# Patient Record
Sex: Male | Born: 2000 | Race: Black or African American | Hispanic: No | Marital: Single | Smoking: Never smoker
Health system: Southern US, Community
[De-identification: ages and names within clinical notes are randomized; demographics above are authoritative.]

---
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
## 2021-01-25 ENCOUNTER — Encounter: Payer: Self-pay | Admitting: Family Medicine

## 2021-01-25 ENCOUNTER — Other Ambulatory Visit: Payer: Self-pay

## 2021-01-25 ENCOUNTER — Ambulatory Visit: Payer: Medicaid Other | Admitting: Family Medicine

## 2021-01-25 DIAGNOSIS — Z113 Encounter for screening for infections with a predominantly sexual mode of transmission: Secondary | ICD-10-CM | POA: Diagnosis not present

## 2021-01-25 LAB — HM HIV SCREENING LAB: HM HIV Screening: NEGATIVE

## 2021-01-25 NOTE — Progress Notes (Signed)
[facility] Department STI clinic/screening visit  Subjective:  Adrian Price is a 21 y.o. male being seen today for an STI screening visit. The patient reports they do not have symptoms.    Patient has the following medical conditions:  There are no problems to display for this patient.    Chief Complaint  Patient presents with   SEXUALLY TRANSMITTED DISEASE    screening    HPI  Patient reports here for screening, denies s/sx   Does the patient or their partner desires a pregnancy in the next year? No  Screening for MPX risk: Does the patient have an unexplained rash? No Is the patient MSM? No Does the patient endorse multiple sex partners or anonymous sex partners? No Did the patient have close or sexual contact with a person diagnosed with MPX? No Has the patient traveled outside the [place] where MPX is endemic? No Is there a high clinical suspicion for MPX-- evidenced by one of the following No  -Unlikely to be chickenpox  -Lymphadenopathy  -Rash that present in same phase of evolution on any given body part   See flowsheet for further details and programmatic requirements.    The following portions of the patient's history were reviewed and updated as appropriate: allergies, current medications, past medical history, past social history, past surgical history and problem list.  Objective:  There were no vitals filed for this visit.  Physical Exam Constitutional:      Appearance: Normal appearance.  HENT:     Head: Normocephalic.     Mouth/Throat:     Mouth: Mucous membranes are moist.     Pharynx: Oropharynx is clear. No oropharyngeal exudate.  Genitourinary:    Penis: Normal.      Testes: Normal.     Comments: No lice, nits, or pest, no lesions or odor discharge.  Denies pain or tenderness with paplation of testicles.  No lesions, ulcers or masses present.    Musculoskeletal:     Cervical back: Normal range of motion.  Lymphadenopathy:      Cervical: No cervical adenopathy.  Skin:    General: Skin is warm and dry.     Findings: No bruising, erythema, lesion or rash.  Neurological:     Mental Status: He is alert.  Psychiatric:        Mood and Affect: Mood normal.        Behavior: Behavior normal.      Assessment and Plan:  Adrian Price is a 21 y.o. male presenting to the [facility] Department for STI screening  1. Screening for venereal disease Patient accepted all screenings including urine CT/GC and bloodwork for HIV/RPR.  Patient meets criteria for HepB screening? Yes. Ordered? No - declined  Patient meets criteria for HepC screening? Yes. Ordered? No - declined   Discussed time line for State Lab results and that patient will be called with positive results and encouraged patient to call if he had not heard in 2 weeks.  Counseled to return or seek care for continued or worsening symptoms Recommended condom use with all sex  Patient is currently using  condoms   to prevent pregnancy.   - HIV [place] [facility] - Syphilis Serology, [place] [facility] - Chlamydia/Gonorrhea [place] [facility]   Return for as needed.  No future appointments.  Wendi Snipes, FNP

## 2021-02-01 ENCOUNTER — Telehealth: Payer: Self-pay

## 2021-02-01 NOTE — Telephone Encounter (Signed)
Calling pt regarding positive chlamydia result from 01/25/21 urine sample. Pt needs tx appt.

## 2021-02-02 NOTE — Telephone Encounter (Signed)
Phone call to pt at [phone]. Voicemail not set up yet.  Unable to leave message. Tried twice.  MyChart is pending.

## 2021-02-04 NOTE — Telephone Encounter (Signed)
Phone call to pt at [phone]. Voicemail not set up yet.  Unable to leave message.  Tried again. Pt answered phone and confirmed password from last visit. Pt counseled about positive chlamydia results.  Per pt request, tx appt schedule for 02/08/21. Pt states NKA.

## 2021-02-08 ENCOUNTER — Other Ambulatory Visit: Payer: Self-pay

## 2021-02-08 ENCOUNTER — Ambulatory Visit: Payer: Medicaid Other

## 2021-02-08 DIAGNOSIS — A749 Chlamydial infection, unspecified: Secondary | ICD-10-CM

## 2021-02-08 MED ORDER — DOXYCYCLINE HYCLATE 100 MG PO TABS: 100.0000 mg | ORAL_TABLET | Freq: Two times a day (BID) | ORAL | 0 refills | Status: AC

## 2021-02-08 NOTE — Progress Notes (Signed)
In Nurse Clinic for chlamydia treatment. Treated per SO Dr Karyl Kinnier with doxycycline 100 mg bid x 7 days. RN dispensed Doxycycline 100 mg #14 today. Instructions explained. Advised to contact [facility] if vomits within 2 hrs of taking med. Questions answered and reports understanding. Jerel Shepherd, RN

## 2021-07-06 ENCOUNTER — Encounter: Payer: Self-pay | Admitting: Nurse Practitioner

## 2021-07-06 ENCOUNTER — Ambulatory Visit: Payer: Self-pay | Admitting: Nurse Practitioner

## 2021-07-06 DIAGNOSIS — Z113 Encounter for screening for infections with a predominantly sexual mode of transmission: Secondary | ICD-10-CM

## 2021-07-06 LAB — HEPATITIS B SURFACE ANTIGEN: Hepatitis B Surface Ag: NONREACTIVE

## 2021-07-06 LAB — HM HIV SCREENING LAB: HM HIV Screening: NEGATIVE

## 2021-07-06 LAB — HM HEPATITIS C SCREENING LAB: HM Hepatitis Screen: NEGATIVE

## 2021-07-06 LAB — GRAM STAIN

## 2021-07-06 NOTE — Progress Notes (Signed)
Pt here for STD screening.  Gram stain results reviewed, no treatment required per SO.  Condoms declined.  Berdie Ogren, RN

## 2021-07-06 NOTE — Progress Notes (Signed)
[facility] Department STI clinic/screening visit  Subjective:  Adrian Price is a 21 y.o. male being seen today for an STI screening visit. The patient reports they do have symptoms.    Patient has the following medical conditions:  There are no problems to display for this patient.    Chief Complaint  Patient presents with   SEXUALLY TRANSMITTED DISEASE    Screening    HPI  Patient reports to clinic today for STD screening. Patient reports lower abdominal pain and dysuria for one month.    Does the patient or their partner desires a pregnancy in the next year? No  Screening for MPX risk: Does the patient have an unexplained rash? No Is the patient MSM? No Does the patient endorse multiple sex partners or anonymous sex partners? No Did the patient have close or sexual contact with a person diagnosed with MPX? No Has the patient traveled outside the [place] where MPX is endemic? No Is there a high clinical suspicion for MPX-- evidenced by one of the following No  -Unlikely to be chickenpox  -Lymphadenopathy  -Rash that present in same phase of evolution on any given body part   See flowsheet for further details and programmatic requirements.    There is no immunization history on file for this patient.   The following portions of the patient's history were reviewed and updated as appropriate: allergies, current medications, past medical history, past social history, past surgical history and problem list.  Objective:  There were no vitals filed for this visit.  Physical Exam Constitutional:      Appearance: Normal appearance.  HENT:     Head: Normocephalic. No abrasion, masses or laceration. Hair is normal.     Right Ear: External ear normal.     Left Ear: External ear normal.     Nose: Nose normal.     Mouth/Throat:     Lips: Pink.     Mouth: Mucous membranes are moist. No oral lesions.     Dentition: No dental caries.     Pharynx: No pharyngeal  swelling, oropharyngeal exudate, posterior oropharyngeal erythema or uvula swelling.     Tonsils: No tonsillar exudate or tonsillar abscesses.  Eyes:     General: Lids are normal.        Right eye: No discharge.        Left eye: No discharge.     Conjunctiva/sclera: Conjunctivae normal.     Right eye: No exudate.    Left eye: No exudate.    Pupils: Pupils are equal, round, and reactive to light.  Abdominal:     General: Abdomen is flat.     Palpations: Abdomen is soft.     Tenderness: There is no abdominal tenderness. There is no rebound.  Genitourinary:    Pubic Area: No rash or pubic lice.      Penis: Normal and circumcised. No erythema or discharge.      Testes: Normal.        Right: Mass or tenderness not present.        Left: Mass or tenderness not present.     Rectum: Normal.     Comments: Discharge amount: None Color: None  Musculoskeletal:     Cervical back: Full passive range of motion without pain, normal range of motion and neck supple.  Lymphadenopathy:     Cervical: No cervical adenopathy.     Right cervical: No superficial, deep or posterior cervical adenopathy.    Left  cervical: No superficial, deep or posterior cervical adenopathy.     Upper Body:     Right upper body: No supraclavicular, axillary or epitrochlear adenopathy.     Left upper body: No supraclavicular, axillary or epitrochlear adenopathy.     Lower Body: Right inguinal adenopathy present. Left inguinal adenopathy present.  Skin:    General: Skin is warm and dry.     Findings: No lesion or rash.  Neurological:     Mental Status: He is alert and oriented to person, place, and time.  Psychiatric:        Attention and Perception: Attention normal.        Mood and Affect: Mood normal.        Speech: Speech normal.        Behavior: Behavior normal. Behavior is cooperative.       Assessment and Plan:  Adrian Price is a 21 y.o. male presenting to the [facility] Department for STI  screening  1. Screening examination for venereal disease -21 year old male in clinic today for STD screening. -Patient does have STI symptoms Patient accepted all screenings including urine CT/GC, gram stain and bloodwork for HIV/RPR.  Patient meets criteria for HepB screening? Yes. Ordered? Yes Patient meets criteria for HepC screening? Yes. Ordered? Yes Recommended condom use with all sex Discussed importance of condom use for STI prevent  Treat gram stain per standing order Discussed time line for State Lab results and that patient will be called with positive results and encouraged patient to call if he had not heard in 2 weeks Recommended returning for continued or worsening symptoms.    - HIV/HCV [place] [facility] - Syphilis Serology,  Lab - HBV Antigen/Antibody [facility] Lab - Gram stain - Chlamydia/GC [facility], Confirmation     Return if symptoms worsen or fail to improve.    Glenna Fellows, FNP

## 2021-07-09 LAB — CHLAMYDIA/GC NAA, CONFIRMATION
Chlamydia trachomatis, NAA: NEGATIVE
Neisseria gonorrhoeae, NAA: NEGATIVE

## 2021-08-17 ENCOUNTER — Encounter: Payer: Self-pay | Admitting: Nurse Practitioner

## 2021-08-17 ENCOUNTER — Ambulatory Visit: Payer: Medicaid Other | Admitting: Nurse Practitioner

## 2021-08-17 DIAGNOSIS — Z113 Encounter for screening for infections with a predominantly sexual mode of transmission: Secondary | ICD-10-CM

## 2021-08-17 LAB — GRAM STAIN

## 2021-08-17 NOTE — Progress Notes (Signed)
Patient here for STD screening. Gram stain reviewed, no tx per standing orders. Condoms declined.

## 2021-08-17 NOTE — Progress Notes (Signed)
[facility] Department STI clinic/screening visit  Subjective:  Adrian Price is a 21 y.o. male being seen today for an STI screening visit. The patient reports they do have symptoms.    Patient has the following medical conditions:  There are no problems to display for this patient.    Chief Complaint  Patient presents with   SEXUALLY TRANSMITTED DISEASE    Screening patient is having pain in abdomen and having trouble urinating and is having some discharge.     HPI  Patient reports to clinic today for STD screening.  Patient was seen in June for an STD screening and at that time he had complained of lower abdominal pain and dysuria.  Test results from June were negative.  Since patient has been seen he reports an increase in lower abdominal pain, dysuria, and discharge.    Does the patient or their partner desires a pregnancy in the next year? No  Screening for MPX risk: Does the patient have an unexplained rash? No Is the patient MSM? No Does the patient endorse multiple sex partners or anonymous sex partners? No Did the patient have close or sexual contact with a person diagnosed with MPX? No Has the patient traveled outside the [place] where MPX is endemic? No Is there a high clinical suspicion for MPX-- evidenced by one of the following No  -Unlikely to be chickenpox  -Lymphadenopathy  -Rash that present in same phase of evolution on any given body part   See flowsheet for further details and programmatic requirements.    There is no immunization history on file for this patient.   The following portions of the patient's history were reviewed and updated as appropriate: allergies, current medications, past medical history, past social history, past surgical history and problem list.  Objective:  There were no vitals filed for this visit.  Physical Exam Constitutional:      Appearance: Normal appearance.  HENT:     Head: Normocephalic. No abrasion,  masses or laceration. Hair is normal.     Right Ear: External ear normal.     Left Ear: External ear normal.     Nose: Nose normal.     Mouth/Throat:     Mouth: No oral lesions.     Dentition: No dental caries.     Pharynx: No pharyngeal swelling, oropharyngeal exudate, posterior oropharyngeal erythema or uvula swelling.     Tonsils: No tonsillar exudate or tonsillar abscesses.  Eyes:     General: Lids are normal.        Right eye: No discharge.        Left eye: No discharge.     Conjunctiva/sclera: Conjunctivae normal.     Right eye: No exudate.    Left eye: No exudate. Abdominal:     General: Abdomen is flat.     Palpations: Abdomen is soft.     Tenderness: There is no abdominal tenderness. There is no rebound.  Genitourinary:    Pubic Area: No rash or pubic lice.      Penis: Normal and circumcised. No erythema or discharge.      Testes: Normal.        Right: Mass or tenderness not present.        Left: Mass or tenderness not present.     Rectum: Normal.     Comments: Discharge amount: None  Color: None  Musculoskeletal:     Cervical back: Full passive range of motion without pain, normal range of  motion and neck supple.  Lymphadenopathy:     Cervical: No cervical adenopathy.     Right cervical: No superficial, deep or posterior cervical adenopathy.    Left cervical: No superficial, deep or posterior cervical adenopathy.     Upper Body:     Right upper body: No supraclavicular, axillary or epitrochlear adenopathy.     Left upper body: No supraclavicular, axillary or epitrochlear adenopathy.     Lower Body: No right inguinal adenopathy. No left inguinal adenopathy.  Skin:    General: Skin is warm and dry.     Findings: No lesion or rash.  Neurological:     Mental Status: He is alert and oriented to person, place, and time.  Psychiatric:        Attention and Perception: Attention normal.        Mood and Affect: Mood normal.        Speech: Speech normal.         Behavior: Behavior normal. Behavior is cooperative.       Assessment and Plan:  JUELL RADNEY is a 21 y.o. male presenting to the [facility] Department for STI screening  1. Screening examination for venereal disease -21 year old male in clinic today for an STD screening. -Patient does have STI symptoms Patient accepted all screenings including urine CT/GC, gram stain and declines bloodwork for HIV/RPR.  Patient meets criteria for HepB screening? No. Ordered? No - low risk Patient meets criteria for HepC screening? No. Ordered? No - low risk Recommended condom use with all sex Discussed importance of condom use for STI prevent  Treat gram stain per standing order.  If gram stain and urine negative advised patient to follow up with PCP or an Urgent care to rule out or address any issues with the urinary tract.  Discussed time line for State Lab results and that patient will be called with positive results and encouraged patient to call if he had not heard in 2 weeks Recommended returning for continued or worsening symptoms.    - Chlamydia/GC NAA, Confirmation - Gram stain   Total time spent: 30 minutes  Return if symptoms worsen or fail to improve.   Glenna Fellows, FNP

## 2021-08-21 LAB — CHLAMYDIA/GC NAA, CONFIRMATION
Chlamydia trachomatis, NAA: NEGATIVE
Neisseria gonorrhoeae, NAA: NEGATIVE

## 2023-04-08 ENCOUNTER — Encounter (HOSPITAL_COMMUNITY): Payer: Self-pay

## 2023-04-08 ENCOUNTER — Ambulatory Visit (HOSPITAL_COMMUNITY)
Admission: RE | Admit: 2023-04-08 | Discharge: 2023-04-08 | Discharge status: Home / Self Care | Payer: Self-pay | Source: Ambulatory Visit | Attending: Emergency Medicine | Admitting: Emergency Medicine

## 2023-04-08 VITALS — BP 115/73 | HR 66 | Temp 98.1°F | Resp 20 | Ht 65.0 in | Wt 145.0 lb

## 2023-04-08 DIAGNOSIS — I8001 Phlebitis and thrombophlebitis of superficial vessels of right lower extremity: Secondary | ICD-10-CM | POA: Diagnosis not present

## 2023-04-08 NOTE — Discharge Instructions (Addendum)
 Rather than wait to be seen by primary care provider, I have been advised that we can order ultrasounds here at this urgent care location only.  I have ordered this study for you to be performed at [facility]  The instructions are included in this visit summary.  Once we have the results of your ultrasound, we will provide you with further recommendations for treatment, if any are needed.  Thank you for visiting [facility] today.

## 2023-04-08 NOTE — ED Triage Notes (Signed)
 Pt states that he has some right leg pain. X3 weeks  Pt denies any known injury.

## 2023-04-08 NOTE — ED Provider Notes (Signed)
 [facility]    CSN: [id number] Arrival date & time: 04/08/23  1154    HISTORY   Chief Complaint  Patient presents with  . Leg Pain    May have pulled a muscle. Has been hurting for over a week. - Entered by patient   HPI CONG Adrian Price is a pleasant, 23 y.o. male who presents to urgent care today. Pt states that he has had pain on the medial aspect of his right thigh for the past 3 weeks, has noticed a "swollen, green nerve" in this location as well.  Pt denies any known injury to his right leg.  Patient denies history of clotting disorder or similar issue in the past.  Patient states the pain is mild, worse when pressing on it.  Patient states the pain is not worse when standing or when he is active.  The history is provided by the patient.   History reviewed. No pertinent past medical history. There are no active problems to display for this patient.  History reviewed. No pertinent surgical history.  Home Medications    Prior to Admission medications   Not on File    Family History History reviewed. No pertinent family history. Social History Social History   Tobacco Use  . Smoking status: Never  . Smokeless tobacco: Never  Vaping Use  . Vaping status: Every Day  . Substances: Nicotine, Flavoring  Substance Use Topics  . Alcohol use: Never  . Drug use: Not Currently    Frequency: 7.0 times per week    Types: Marijuana    Comment: daily   Allergies   Other  Review of Systems Review of Systems Pertinent findings revealed after performing a 14 point review of systems has been noted in the history of present illness.  Physical Exam Vital Signs BP 115/73 (BP Location: Left Arm)   Pulse 66   Temp 98.1 F (36.7 C) (Oral)   Resp 20   Ht 5\' 5"  (1.651 m)   Wt 145 lb (65.8 kg)   SpO2 98%   BMI 24.13 kg/m   No data found.  Physical Exam Vitals and nursing note reviewed.  Constitutional:      General: He is not in acute distress.     Appearance: Normal appearance. He is normal weight. He is not ill-appearing.  HENT:     Head: Normocephalic and atraumatic.  Eyes:     Extraocular Movements: Extraocular movements intact.     Conjunctiva/sclera: Conjunctivae normal.     Pupils: Pupils are equal, round, and reactive to light.  Cardiovascular:     Rate and Rhythm: Normal rate and regular rhythm.  Pulmonary:     Effort: Pulmonary effort is normal.     Breath sounds: Normal breath sounds.  Musculoskeletal:        General: Normal range of motion.     Cervical back: Normal range of motion and neck supple.       Legs:  Skin:    General: Skin is warm and dry.  Neurological:     General: No focal deficit present.     Mental Status: He is alert and oriented to person, place, and time. Mental status is at baseline.  Psychiatric:        Mood and Affect: Mood normal.        Behavior: Behavior normal.        Thought Content: Thought content normal.        Judgment: Judgment normal.  UC Couse / Diagnostics / Procedures:     Radiology LE VENOUS Result Date: 04/09/2023  Lower Venous DVT Study Patient Name:  Adrian Price  Date of Exam:   04/09/2023 Medical Rec #: [id number]          Accession #:    [id number] Date of Birth: 2000/09/20           Patient Gender: M Patient Age:   22 years Exam Location:  [facility] Procedure:      VAS [place] LOWER EXTREMITY VENOUS (DVT) Referring Phys: Theadora Rama --------------------------------------------------------------------------------  Indications: Pain in right upper thigh for 3 weeks.  Comparison Study: No priors. Performing Technologist: Waldwick Sink Sturdivant-Jones RDMS, RVT  Examination Guidelines: A complete evaluation includes B-mode imaging, spectral Doppler, color Doppler, and power Doppler as needed of all accessible portions of each vessel. Bilateral testing is considered an integral part of a complete examination. Limited examinations for reoccurring indications may be performed  as noted. The reflux portion of the exam is performed with the patient in reverse Trendelenburg.  +---------+---------------+---------+-----------+----------+--------------+ RIGHT    CompressibilityPhasicitySpontaneityPropertiesThrombus Aging +---------+---------------+---------+-----------+----------+--------------+ CFV      Full           Yes      Yes                                 +---------+---------------+---------+-----------+----------+--------------+ SFJ      Full                                                        +---------+---------------+---------+-----------+----------+--------------+ FV Prox  Full                                                        +---------+---------------+---------+-----------+----------+--------------+ FV Mid   Full                                                        +---------+---------------+---------+-----------+----------+--------------+ FV DistalFull                                                        +---------+---------------+---------+-----------+----------+--------------+ PFV      Full                                                        +---------+---------------+---------+-----------+----------+--------------+ POP      Full           Yes      Yes                                 +---------+---------------+---------+-----------+----------+--------------+  PTV      Full                                                        +---------+---------------+---------+-----------+----------+--------------+ PERO     Full                                                        +---------+---------------+---------+-----------+----------+--------------+   +----+---------------+---------+-----------+----------+--------------+ LEFTCompressibilityPhasicitySpontaneityPropertiesThrombus Aging +----+---------------+---------+-----------+----------+--------------+ CFV Full           Yes      Yes                                  +----+---------------+---------+-----------+----------+--------------+ SFJ Full                                                        +----+---------------+---------+-----------+----------+--------------+     Summary: RIGHT: - There is no evidence of deep vein thrombosis in the lower extremity.  - No cystic structure found in the popliteal fossa.  LEFT: - No evidence of common femoral vein obstruction.   *See table(s) above for measurements and observations.    Preliminary     Procedures Procedures (including critical care time) EKG  Pending results:  Labs Reviewed - No data to display  Medications Ordered in UC: Medications - No data to display  UC Diagnoses / Final Clinical Impressions(s)   I have reviewed the triage vital signs and the nursing notes.  Pertinent labs & imaging results that were available during my care of the patient were reviewed by me and considered in my medical decision making (see chart for details).    Final diagnoses:  Phlebitis of superficial vein of lower extremity, right   Venous ultrasound ordered to evaluate for thrombosis of inflamed superficial vein of right lower extremity.  Will notify patient of results of venous ultrasound once received.  If thrombosis is present, we will discuss options for anticoagulation.  Of note: At time of writing of this note, venous ultrasound results available and were negative for thrombosis.  No treatment needed.    Please see discharge instructions below for details of plan of care as provided to patient. ED Prescriptions   None    PDMP not reviewed this encounter.    Discharge Instructions      Rather than wait to be seen by primary care provider, I have been advised that we can order ultrasounds here at this urgent care location only.  I have ordered this study for you to be performed at [facility].  The instructions are included in this visit summary.  Once we  have the results of your ultrasound, we will provide you with further recommendations for treatment, if any are needed.  Thank you for visiting [facility] today.      Disposition Upon Discharge:  Condition: stable for discharge home Home: take medications as prescribed; routine discharge instructions as  discussed; follow up as advised.  Patient presented with an acute illness with associated systemic symptoms and significant discomfort requiring urgent management. In my opinion, this is a condition that a prudent lay person (someone who possesses an average knowledge of health and medicine) may potentially expect to result in complications if not addressed urgently such as respiratory distress, impairment of bodily function or dysfunction of bodily organs.   Routine symptom specific, illness specific and/or disease specific instructions were discussed with the patient and/or caregiver at length.   As such, the patient has been evaluated and assessed, work-up was performed and treatment was provided in alignment with urgent care protocols and evidence based medicine.  Patient/parent/caregiver has been advised that the patient may require follow up for further testing and treatment if the symptoms continue in spite of treatment, as clinically indicated and appropriate.  Patient/parent/caregiver has been advised to report to orthopedic urgent care clinic or return to the [facility] or PCP in 3-5 days if no better; follow-up with orthopedics, PCP or the Emergency Department if new signs and symptoms develop or if the current signs or symptoms continue to change or worsen for further workup, evaluation and treatment as clinically indicated and appropriate  The patient will follow up with their current PCP if and as advised. If the patient does not currently have a PCP we will have assisted them in obtaining one.   The patient may need specialty follow up if the symptoms continue, in spite of  conservative treatment and management, for further workup, evaluation, consultation and treatment as clinically indicated and appropriate.  Patient/parent/caregiver verbalized understanding and agreement of plan as discussed.  All questions were addressed during visit.  Please see discharge instructions below for further details of plan.  This office note has been dictated using Teaching laboratory technician.  Unfortunately, this method of dictation can sometimes lead to typographical or grammatical errors.  I apologize for your inconvenience in advance if this occurs.  Please do not hesitate to reach out to me if clarification is needed.      Theadora Rama Scales, PA-C 04/09/23 1419

## 2023-04-09 ENCOUNTER — Ambulatory Visit (HOSPITAL_COMMUNITY)
Admission: RE | Admit: 2023-04-09 | Discharge: 2023-04-09 | Disposition: A | Discharge status: Home / Self Care | Source: Ambulatory Visit | Attending: Emergency Medicine | Admitting: Emergency Medicine

## 2023-04-09 DIAGNOSIS — M79651 Pain in right thigh: Secondary | ICD-10-CM | POA: Diagnosis not present

## 2023-04-12 ENCOUNTER — Ambulatory Visit: Payer: Self-pay | Admitting: Nurse Practitioner

## 2023-04-12 DIAGNOSIS — Z113 Encounter for screening for infections with a predominantly sexual mode of transmission: Secondary | ICD-10-CM

## 2023-04-12 LAB — HM HIV SCREENING LAB: HM HIV Screening: NEGATIVE

## 2023-04-12 LAB — GRAM STAIN

## 2023-04-12 LAB — HM HEPATITIS C SCREENING LAB: HM Hepatitis Screen: NEGATIVE

## 2023-04-12 NOTE — Progress Notes (Signed)
 Pt is here for STD screening. Patient declined condoms and opportunity given to ask questions for any clarifications. Sonda Primes, RN.

## 2023-04-14 LAB — CHLAMYDIA/GC NAA, CONFIRMATION
Chlamydia trachomatis, NAA: NEGATIVE
Neisseria gonorrhoeae, NAA: NEGATIVE

## 2023-04-18 ENCOUNTER — Encounter: Payer: Self-pay | Admitting: Nurse Practitioner

## 2023-04-18 LAB — GONOCOCCUS CULTURE

## 2023-04-18 NOTE — Progress Notes (Signed)
 [facility] Department STI clinic [address], [address] [place] [address] Main phone: [phone]  STI screening visit  Subjective:  Adrian Price is a 23 y.o. male being seen today for an STI screening visit. The patient reports they do have symptoms.    Patient has the following medical conditions:  There are no active problems to display for this patient.   Chief Complaint  Patient presents with   SEXUALLY TRANSMITTED DISEASE   Patient is a pleasant 23 y.o. male who presents to the office today requesting symptomatic STI testing.  Patient indicates a knot to his right groin with abdominal pain that began 3 weeks ago. Of note, chart review indicates patient seen in ED 4 days ago with same symptoms with a doppler US ruling out DVT.  He reports 2 male partners in the last 2 months, and practices penile/vaginal penetrative sex and oral sex. Patient reports using condoms sometimes. Patient indicates a history of Chlamydia in 2023. He reports last sex was 2 months ago.    STI screening history: Last HIV test per patient/review of record was  Lab Results  Component Value Date   HMHIVSCREEN Negative - Validated 07/06/2021    Last HEPC test per patient/review of record was  Lab Results  Component Value Date   HMHEPCSCREEN Negative-Validated 07/06/2021    Last HEPB test per patient/review of record was No components found for: "HMHEPBSCREEN"   Fertility: Does the patient or their partner desires a pregnancy in the next year? No  Screening for MPX risk: Does the patient have an unexplained rash? No Is the patient MSM? No Does the patient endorse multiple sex partners or anonymous sex partners? Yes Did the patient have close or sexual contact with a person diagnosed with MPX? No Has the patient traveled outside the [place] where MPX is endemic? No Is there a high clinical suspicion for MPX-- evidenced by one of the following No  -Unlikely to be  chickenpox  -Lymphadenopathy  -Rash that present in same phase of evolution on any given body part   See flowsheet for further details and programmatic requirements.    There is no immunization history on file for this patient.   The following portions of the patient's history were reviewed and updated as appropriate: allergies, current medications, past medical history, past social history, past surgical history and problem list.  Objective:  There were no vitals filed for this visit.  Physical Exam Nursing note reviewed. Chaperone present: Declined.  Constitutional:      Appearance: Normal appearance.  HENT:     Head: Normocephalic.     Salivary Glands: Right salivary gland is not diffusely enlarged or tender. Left salivary gland is not diffusely enlarged or tender.     Mouth/Throat:     Lips: Pink. No lesions.     Mouth: Mucous membranes are moist. No oral lesions.     Tongue: No lesions. Tongue does not deviate from midline.     Pharynx: Oropharynx is clear. Uvula midline. No oropharyngeal exudate or posterior oropharyngeal erythema.     Tonsils: No tonsillar exudate.  Eyes:     General:        Right eye: No discharge.        Left eye: No discharge.     Conjunctiva/sclera:     Right eye: Right conjunctiva is not injected. No exudate.    Left eye: Left conjunctiva is not injected. No exudate. Pulmonary:     Effort:  Pulmonary effort is normal.  Genitourinary:    Pubic Area: No rash or pubic lice.      Penis: Normal. No tenderness, discharge, swelling or lesions.      Testes: Normal.     Epididymis:     Right: Normal. No mass or tenderness.     Left: Normal. No mass or tenderness.     Tanner stage (genital): 5.  Lymphadenopathy:     Head:     Right side of head: No submental, submandibular, tonsillar, preauricular or posterior auricular adenopathy.     Left side of head: No submental, submandibular, tonsillar, preauricular or posterior auricular adenopathy.      Cervical: No cervical adenopathy.     Right cervical: No superficial or posterior cervical adenopathy.    Left cervical: No superficial or posterior cervical adenopathy.     Upper Body:     Right upper body: No supraclavicular or axillary adenopathy.     Left upper body: No supraclavicular or axillary adenopathy.     Lower Body: Right inguinal adenopathy present. No left inguinal adenopathy.  Skin:    General: Skin is warm and dry.     Findings: No lesion or rash.     Comments: Skin tone appropriate for ethnicity.   Neurological:     Mental Status: He is alert and oriented to person, place, and time.  Psychiatric:        Attention and Perception: Attention and perception normal.        Mood and Affect: Mood and affect normal.        Speech: Speech normal.        Behavior: Behavior normal. Behavior is cooperative.        Thought Content: Thought content normal.     Assessment and Plan:  Adrian Price is a 23 y.o. male presenting to the [facility] Department for STI screening  1. Screening for venereal disease (Primary) Penile gram stain negative in office. No treatment indicated.   - Gonococcus culture - HBV Antigen/Antibody State Lab - HIV/HCV [facility] - Syphilis Serology, [facility] - Chlamydia/GC NAA, Confirmation - Gram stain  Patient does have STI symptoms Patient accepted all screenings including  urine GC/Chlamydia, and blood work for HIV/Syphilis. Patient meets criteria for HepB screening? Yes. Ordered? yes Patient meets criteria for HepC screening? Yes. Ordered? yes Recommended condom use with all sex Discussed importance of condom use for STI prevention  Treat positive test results per standing order. Discussed time line for State Lab results and that patient will be called with positive results and encouraged patient to call if he had not heard in 2 weeks Recommended repeat testing in 3 months with positive results. Recommended returning  for continued or worsening symptoms.   Return if symptoms worsen or fail to improve.  No future appointments.  Total time with patient 20 minutes.   Edmonia James, NP

## 2023-06-22 NOTE — Addendum Note (Signed)
 Addended by: Memory Staggers R on: 06/22/2023 11:11 AM   Modules accepted: Orders
